# Patient Record
Sex: Female | Born: 2005 | Race: White | Hispanic: No | Marital: Single
Health system: Southern US, Community
[De-identification: ages and names within clinical notes are randomized; demographics above are authoritative.]

## PROBLEM LIST (undated history)

## (undated) ENCOUNTER — Emergency Department (HOSPITAL_COMMUNITY): Payer: Managed Care, Other (non HMO)

---
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
## 2023-10-22 ENCOUNTER — Ambulatory Visit: Admitting: Family
